# Patient Record
Sex: Female | Born: 1935
Health system: Southern US, Community
[De-identification: ages and names within clinical notes are randomized; demographics above are authoritative.]

## PROBLEM LIST (undated history)

## (undated) DIAGNOSIS — R739 Hyperglycemia, unspecified: Secondary | ICD-10-CM

## (undated) DIAGNOSIS — I10 Essential (primary) hypertension: Secondary | ICD-10-CM

## (undated) DIAGNOSIS — M810 Age-related osteoporosis without current pathological fracture: Secondary | ICD-10-CM

## (undated) HISTORY — DX: Essential (primary) hypertension: I10

## (undated) HISTORY — DX: Age-related osteoporosis without current pathological fracture: M81.0

## (undated) HISTORY — PX: KIDNEY SURGERY: SHX687

## (undated) HISTORY — DX: Hyperglycemia, unspecified: R73.9

## (undated) HISTORY — PX: THYROID SURGERY: SHX805

---

## 2018-04-25 DIAGNOSIS — G40909 Epilepsy, unspecified, not intractable, without status epilepticus: Secondary | ICD-10-CM | POA: Diagnosis not present

## 2018-04-25 DIAGNOSIS — M810 Age-related osteoporosis without current pathological fracture: Secondary | ICD-10-CM | POA: Diagnosis not present

## 2018-04-25 DIAGNOSIS — I10 Essential (primary) hypertension: Secondary | ICD-10-CM | POA: Diagnosis not present

## 2018-04-29 DIAGNOSIS — T781XXA Other adverse food reactions, not elsewhere classified, initial encounter: Secondary | ICD-10-CM | POA: Diagnosis not present

## 2018-06-20 DIAGNOSIS — M810 Age-related osteoporosis without current pathological fracture: Secondary | ICD-10-CM | POA: Diagnosis not present

## 2018-07-01 ENCOUNTER — Ambulatory Visit: Payer: Self-pay | Admitting: Endocrinology

## 2018-07-11 DIAGNOSIS — M810 Age-related osteoporosis without current pathological fracture: Secondary | ICD-10-CM | POA: Diagnosis not present

## 2018-07-21 NOTE — Progress Notes (Signed)
Mardene Celeste called back. There was not any imaging reports included in the records. She provided me with contact information to [facility] Called [phone] and spoke with Sam. She could not confirm or deny any info over the phone, but asked I send request on our letter head for MRI/CT to f#[phone]. Faxed request.  Mardene Celeste also gave me a # to a Dr. Alvina Filbert [phone]

## 2018-07-21 NOTE — Progress Notes (Signed)
Called Dr. Sherryll Burger office, spoke with Mardene Celeste in medical records to determine if they had a copy of MRI or CT reports from [place]. Mardene Celeste stated they have records scanned in from [place]. She is printing the 30 pages and will call me when completed, I will pick them up.

## 2018-07-23 ENCOUNTER — Telehealth (INDEPENDENT_AMBULATORY_CARE_PROVIDER_SITE_OTHER): Payer: Medicare HMO | Admitting: Neurology

## 2018-07-23 ENCOUNTER — Telehealth: Payer: Self-pay

## 2018-07-23 ENCOUNTER — Encounter: Payer: Self-pay | Admitting: Neurology

## 2018-07-23 ENCOUNTER — Other Ambulatory Visit: Payer: Self-pay

## 2018-07-23 VITALS — BP 127/75 | HR 97 | Temp 97.5°F | Ht 60.0 in | Wt 130.0 lb

## 2018-07-23 DIAGNOSIS — D320 Benign neoplasm of cerebral meninges: Secondary | ICD-10-CM

## 2018-07-23 DIAGNOSIS — Z87898 Personal history of other specified conditions: Secondary | ICD-10-CM

## 2018-07-23 NOTE — Telephone Encounter (Signed)
[place], [phone] spoke with Nam in Med rec. She asked I fax request on our letterhead to f# [phone] attn med rec. I advised Pt has appt today and need ASAP.  We rcvd fax from  [place]. It indicated there was not an MRI or CT performed there.

## 2018-07-23 NOTE — Progress Notes (Signed)
Virtual Visit via Video Note The purpose of this virtual visit is to provide medical care while limiting exposure to the novel coronavirus.    Consent was obtained for video visit:  Yes Answered questions that patient had about telehealth interaction:  Yes I discussed the limitations, risks, security and privacy concerns of performing an evaluation and management service by telemedicine. I also discussed with the patient that there may be a patient responsible charge related to this service. The patient expressed understanding and agreed to proceed.  Pt location: Home Physician Location: Home Name of referring provider:  Lajean Manes, MD I connected with Karie Mainland at patients initiation/request on 07/23/2018 at 11:10 AM EDT by video enabled telemedicine application and verified that I am speaking with the correct person using two identifiers. Pt MRN:  [id number] Pt DOB:  [birth year] Video Participants:  Karie Mainland;  Her husband   History of Present Illness:  Gina Pham is an 85 year old woman who presents to establish care regarding meningioma and isolated seizure in setting of meningioma.  In February 2015, she was treated for an infection with a sulfa antibiotic and had a severe anaphylactic reaction.  After discharge from the hospital, she had an episode of incoherence.  She followed up with her PCP who noted that she was acting confused.  She was sent to the ED at the [facility], where CT of head showed a meningioma in the right temporal lobe.  She had an EEG which was reportedly abnormal and diagnosed with probable seizure.  She was started on Keppra 750mg  daily and has not had a recurrent spell.  Once in a while she may see something that is not there, like an animal in the shadows of the foliage, but it is brief and infrequent.  No episodes of phantosmia, abnormal jerking, loss of awareness or subsequent episode of confusion.  Every couple of years, she would have repeat  imaging to monitor the meningioma.  Last MRI of brain with and without contrast from 01/28/17 showed 3.2 x 1.9 x 2.4 cm meningioma in the right middle cranial fossa (miimally increased in size from prior MRI on 04/13/15 which was 3.2 x 1.7 x 2.4 cm) with no significant change in mild regional mass effect and without adjacent parenchymal signal abnormality.  About 4-5 months ago, she and her husband moved from [place] to [place] to live near their children.  They live at [place].  They are wishing to establish care with a new neurologist.  Of note, her mother had a meningioma diagnosed in her 85Y without complication for the rest of her life at age 73.  Past Medical History: hyperlipidemia  Medications: Outpatient Encounter Medications as of 07/23/2018  Medication Sig   cholecalciferol (VITAMIN D3) 25 MCG (1000 UT) tablet Take 2,000 Units by mouth daily.   co-enzyme Q-10 30 MG capsule Take 30 mg by mouth 3 (three) times daily.   Multiple Vitamin (MULTIVITAMIN) tablet Take 1 tablet by mouth daily.   vitamin C (ASCORBIC ACID) 500 MG tablet Take 500 mg by mouth daily.   levETIRAcetam (KEPPRA) 500 MG tablet Take 750 mg by mouth daily.   simvastatin (ZOCOR) 40 MG tablet Take 20 mg by mouth daily.   No facility-administered encounter medications on file as of 07/23/2018.     Allergies: Sulfa  Family History: No family history on file.  Social History: Social History   Socioeconomic History   Marital status: Married    Spouse name: Not  on file   Number of children: Not on file   Years of education: Not on file   Highest education level: Not on file  Occupational History   Not on file  Social Needs   Financial resource strain: Not on file   Food insecurity:    Worry: Not on file    Inability: Not on file   Transportation needs:    Medical: Not on file    Non-medical: Not on file  Tobacco Use   Smoking status: Not on file  Substance and Sexual Activity    Alcohol use: Not on file   Drug use: Not on file   Sexual activity: Not on file  Lifestyle   Physical activity:    Days per week: Not on file    Minutes per session: Not on file   Stress: Not on file  Relationships   Social connections:    Talks on phone: Not on file    Gets together: Not on file    Attends religious service: Not on file    Active member of club or organization: Not on file    Attends meetings of clubs or organizations: Not on file    Relationship status: Not on file   Intimate partner violence:    Fear of current or ex partner: Not on file    Emotionally abused: Not on file    Physically abused: Not on file    Forced sexual activity: Not on file  Other Topics Concern   Not on file  Social History Narrative   Not on file   Observations/Objective:   Vitals:   07/23/18 1111  BP: 127/75  Pulse: 97  Temp: (!) 97.5 F (36.4 C)  Weight: 130 lb (59 kg)  Height: 5' (1.524 m)  no acute distress.  Alert and oriented.  Speech fluent and not dysarthric.  Language intact.  Face symmetric.  Assessment and Plan:   1.  Cerebral meningioma, no significant changes over the past several years.  I don't think we need to recheck another MRI at this time. 2.  Seizure secondary to meningioma, stable on Keppra.  No recurrence.  1.  Keppra 750mg  daily 2.  Follow up in one year or sooner if needed.  Follow Up Instructions:    -I discussed the assessment and treatment plan with the patient. The patient was provided an opportunity to ask questions and all were answered. The patient agreed with the plan and demonstrated an understanding of the instructions.   The patient was advised to call back or seek an in-person evaluation if the symptoms worsen or if the condition fails to improve as anticipated.   Dudley Major, DO

## 2018-07-24 ENCOUNTER — Telehealth: Payer: Self-pay | Admitting: Endocrinology

## 2018-07-24 NOTE — Telephone Encounter (Signed)
LM to call back to reschedule appt for Dr Dwyane Dee - No Show/cancellation list.

## 2018-08-05 DIAGNOSIS — Z79899 Other long term (current) drug therapy: Secondary | ICD-10-CM | POA: Diagnosis not present

## 2018-08-05 DIAGNOSIS — N183 Chronic kidney disease, stage 3 (moderate): Secondary | ICD-10-CM | POA: Diagnosis not present

## 2018-08-05 DIAGNOSIS — G40909 Epilepsy, unspecified, not intractable, without status epilepticus: Secondary | ICD-10-CM | POA: Diagnosis not present

## 2018-08-05 DIAGNOSIS — E7800 Pure hypercholesterolemia, unspecified: Secondary | ICD-10-CM | POA: Diagnosis not present

## 2018-08-05 DIAGNOSIS — I129 Hypertensive chronic kidney disease with stage 1 through stage 4 chronic kidney disease, or unspecified chronic kidney disease: Secondary | ICD-10-CM | POA: Diagnosis not present

## 2018-08-05 DIAGNOSIS — R441 Visual hallucinations: Secondary | ICD-10-CM | POA: Diagnosis not present

## 2018-08-05 DIAGNOSIS — D329 Benign neoplasm of meninges, unspecified: Secondary | ICD-10-CM | POA: Diagnosis not present

## 2018-08-06 DIAGNOSIS — I129 Hypertensive chronic kidney disease with stage 1 through stage 4 chronic kidney disease, or unspecified chronic kidney disease: Secondary | ICD-10-CM | POA: Diagnosis not present

## 2018-08-06 DIAGNOSIS — E612 Magnesium deficiency: Secondary | ICD-10-CM | POA: Diagnosis not present

## 2018-08-06 DIAGNOSIS — Z79899 Other long term (current) drug therapy: Secondary | ICD-10-CM | POA: Diagnosis not present

## 2018-08-06 DIAGNOSIS — G40909 Epilepsy, unspecified, not intractable, without status epilepticus: Secondary | ICD-10-CM | POA: Diagnosis not present

## 2018-08-06 DIAGNOSIS — E7800 Pure hypercholesterolemia, unspecified: Secondary | ICD-10-CM | POA: Diagnosis not present

## 2018-08-08 DIAGNOSIS — H53461 Homonymous bilateral field defects, right side: Secondary | ICD-10-CM | POA: Diagnosis not present

## 2018-08-08 DIAGNOSIS — H53462 Homonymous bilateral field defects, left side: Secondary | ICD-10-CM | POA: Diagnosis not present

## 2018-09-11 DIAGNOSIS — Z7189 Other specified counseling: Secondary | ICD-10-CM | POA: Diagnosis not present

## 2018-09-11 DIAGNOSIS — N183 Chronic kidney disease, stage 3 (moderate): Secondary | ICD-10-CM | POA: Diagnosis not present

## 2018-09-11 DIAGNOSIS — R441 Visual hallucinations: Secondary | ICD-10-CM | POA: Diagnosis not present

## 2018-09-11 DIAGNOSIS — I129 Hypertensive chronic kidney disease with stage 1 through stage 4 chronic kidney disease, or unspecified chronic kidney disease: Secondary | ICD-10-CM | POA: Diagnosis not present

## 2018-09-11 DIAGNOSIS — G40909 Epilepsy, unspecified, not intractable, without status epilepticus: Secondary | ICD-10-CM | POA: Diagnosis not present

## 2018-11-14 DIAGNOSIS — D1801 Hemangioma of skin and subcutaneous tissue: Secondary | ICD-10-CM | POA: Diagnosis not present

## 2018-11-14 DIAGNOSIS — L821 Other seborrheic keratosis: Secondary | ICD-10-CM | POA: Diagnosis not present

## 2018-11-14 DIAGNOSIS — L570 Actinic keratosis: Secondary | ICD-10-CM | POA: Diagnosis not present

## 2018-11-26 DIAGNOSIS — Z119 Encounter for screening for infectious and parasitic diseases, unspecified: Secondary | ICD-10-CM | POA: Diagnosis not present

## 2018-12-04 DIAGNOSIS — Z23 Encounter for immunization: Secondary | ICD-10-CM | POA: Diagnosis not present

## 2018-12-11 DIAGNOSIS — R69 Illness, unspecified: Secondary | ICD-10-CM | POA: Diagnosis not present

## 2019-01-16 DIAGNOSIS — R7303 Prediabetes: Secondary | ICD-10-CM | POA: Diagnosis not present

## 2019-01-16 DIAGNOSIS — R7309 Other abnormal glucose: Secondary | ICD-10-CM | POA: Diagnosis not present

## 2019-01-16 DIAGNOSIS — Z23 Encounter for immunization: Secondary | ICD-10-CM | POA: Diagnosis not present

## 2019-01-16 DIAGNOSIS — G40909 Epilepsy, unspecified, not intractable, without status epilepticus: Secondary | ICD-10-CM | POA: Diagnosis not present

## 2019-01-16 DIAGNOSIS — M810 Age-related osteoporosis without current pathological fracture: Secondary | ICD-10-CM | POA: Diagnosis not present

## 2019-01-16 DIAGNOSIS — N1831 Chronic kidney disease, stage 3a: Secondary | ICD-10-CM | POA: Diagnosis not present

## 2019-01-16 DIAGNOSIS — D329 Benign neoplasm of meninges, unspecified: Secondary | ICD-10-CM | POA: Diagnosis not present

## 2019-01-16 DIAGNOSIS — E7800 Pure hypercholesterolemia, unspecified: Secondary | ICD-10-CM | POA: Diagnosis not present

## 2019-01-16 DIAGNOSIS — I129 Hypertensive chronic kidney disease with stage 1 through stage 4 chronic kidney disease, or unspecified chronic kidney disease: Secondary | ICD-10-CM | POA: Diagnosis not present

## 2019-01-16 DIAGNOSIS — Z0000 Encounter for general adult medical examination without abnormal findings: Secondary | ICD-10-CM | POA: Diagnosis not present

## 2019-01-20 DIAGNOSIS — H2513 Age-related nuclear cataract, bilateral: Secondary | ICD-10-CM | POA: Diagnosis not present

## 2019-01-20 DIAGNOSIS — H25013 Cortical age-related cataract, bilateral: Secondary | ICD-10-CM | POA: Diagnosis not present

## 2019-01-20 DIAGNOSIS — H5340 Unspecified visual field defects: Secondary | ICD-10-CM | POA: Diagnosis not present

## 2019-01-20 DIAGNOSIS — H43813 Vitreous degeneration, bilateral: Secondary | ICD-10-CM | POA: Diagnosis not present

## 2019-01-22 DIAGNOSIS — M810 Age-related osteoporosis without current pathological fracture: Secondary | ICD-10-CM | POA: Diagnosis not present

## 2019-02-03 ENCOUNTER — Telehealth: Payer: Self-pay

## 2019-02-03 DIAGNOSIS — H53461 Homonymous bilateral field defects, right side: Secondary | ICD-10-CM

## 2019-02-03 NOTE — Telephone Encounter (Signed)
Called patient no answer left message to call office back to discuss provider response below

## 2019-02-03 NOTE — Telephone Encounter (Signed)
Called patient no answer left message to call office back to discuss provider response

## 2019-02-03 NOTE — Telephone Encounter (Signed)
-----   Message from Pieter Partridge, DO sent at 02/03/2019  7:04 AM EST ----- Received note from patient's ophthalmologist, Dr. Satira Sark at [facility].  She was evaluated on 01/20/2019.  Findings indicated right inferior visual field defects in both eyes.  This would not be explained by her meningioma, which involves her right cerebral hemisphere.  Therefore, I would like to order MRI of brain with and without contrast for evaluation of right homonymous hemianopsia.  Further recommendations pending results.

## 2019-02-04 NOTE — Telephone Encounter (Signed)
Attempted to call patient again to answer left message to call office back.  Order for MRI place Waiting to discuss with patient.

## 2019-03-05 DIAGNOSIS — Z0000 Encounter for general adult medical examination without abnormal findings: Secondary | ICD-10-CM | POA: Diagnosis not present

## 2019-05-13 DIAGNOSIS — H5340 Unspecified visual field defects: Secondary | ICD-10-CM | POA: Diagnosis not present

## 2019-05-15 DIAGNOSIS — H5340 Unspecified visual field defects: Secondary | ICD-10-CM | POA: Diagnosis not present

## 2019-05-18 DIAGNOSIS — R7303 Prediabetes: Secondary | ICD-10-CM | POA: Diagnosis not present

## 2019-05-18 DIAGNOSIS — H539 Unspecified visual disturbance: Secondary | ICD-10-CM | POA: Diagnosis not present

## 2019-05-18 DIAGNOSIS — Z79899 Other long term (current) drug therapy: Secondary | ICD-10-CM | POA: Diagnosis not present

## 2019-05-18 DIAGNOSIS — I129 Hypertensive chronic kidney disease with stage 1 through stage 4 chronic kidney disease, or unspecified chronic kidney disease: Secondary | ICD-10-CM | POA: Diagnosis not present

## 2019-05-18 DIAGNOSIS — R413 Other amnesia: Secondary | ICD-10-CM | POA: Diagnosis not present

## 2019-05-18 DIAGNOSIS — G40909 Epilepsy, unspecified, not intractable, without status epilepticus: Secondary | ICD-10-CM | POA: Diagnosis not present

## 2019-05-18 DIAGNOSIS — N1831 Chronic kidney disease, stage 3a: Secondary | ICD-10-CM | POA: Diagnosis not present

## 2019-07-21 DIAGNOSIS — R69 Illness, unspecified: Secondary | ICD-10-CM | POA: Diagnosis not present

## 2019-07-23 NOTE — Progress Notes (Signed)
NEUROLOGY FOLLOW UP OFFICE NOTE  Gina Pham [id number]  HISTORY OF PRESENT ILLNESS: Gina Pham is an 86 year old right-handed woman who follows up for meningioma and isolated seizure in setting of meningioma.  She is accompanied by her husband who provides collateral history.  Recent history also supplemented by PCP notes.  UPDATE: Current medications:  Keppra 750mg  daily  She has had baseline mild memory deficits and visual hallucinations for some time, but they have both gotten worse over the past year.  She has increased difficulty remembering appointment times, dates, and word-finding difficulty.  She has not had difficulty recognizing close family and friends.  Visual hallucinations are still periodic but definitely more frequent.  She sees animals as well as people in the yard who are not there.  One time, she saw people across the street with a sign on a door that said "do not enter".  She has not been agitated or confused.  Mood and behavior have been okay.  She overall remains independent in regards to activities of daily living.  Sometimes she has trouble reading clocks or difficulty reaching for objects.  No headaches or clinical seizures.  B12 and thyroid function have been checked by her PCP, and reportedly unremarkable.  She had eye exam back in November which revealed right inferior quandranopia.  HISTORY: In February 2015, she was treated for an infection with a sulfa antibiotic and had a severe anaphylactic reaction.  After discharge from the hospital, she had an episode of incoherence.  She followed up with her PCP who noted that she was acting confused.  She was sent to the ED at the [facility], where CT of head showed a meningioma in the right temporal lobe.  She had an EEG which was reportedly abnormal and diagnosed with probable seizure.  She was started on Keppra 750mg  daily and has not had a recurrent spell.  Once in a while she may see something that is not  there, like an animal in the shadows of the foliage, but it is brief and infrequent.  No episodes of phantosmia, abnormal jerking, loss of awareness or subsequent episode of confusion.  Every couple of years, she would have repeat imaging to monitor the meningioma.  MRI of brain with and without contrast from 01/28/17 showed 3.2 x 1.9 x 2.4 cm meningioma in the right middle cranial fossa (miimally increased in size from prior MRI on 04/13/15 which was 3.2 x 1.7 x 2.4 cm) with no significant change in mild regional mass effect and without adjacent parenchymal signal abnormality.  In early 2020, she and her husband moved from [place] to [place] to live near their children.  They live at [place].  Mother had meninigoma no depression 100.  Father MI in 81s.   PAST MEDICAL HISTORY: No past medical history on file.  MEDICATIONS: Current Outpatient Medications on File Prior to Visit  Medication Sig Dispense Refill  . cholecalciferol (VITAMIN D3) 25 MCG (1000 UT) tablet Take 2,000 Units by mouth daily.    Marland Kitchen co-enzyme Q-10 30 MG capsule Take 30 mg by mouth 3 (three) times daily.    Marland Kitchen levETIRAcetam (KEPPRA) 500 MG tablet Take 750 mg by mouth daily.    . Multiple Vitamin (MULTIVITAMIN) tablet Take 1 tablet by mouth daily.    . simvastatin (ZOCOR) 40 MG tablet Take 20 mg by mouth daily.    . vitamin C (ASCORBIC ACID) 500 MG tablet Take 500 mg by mouth daily.  No current facility-administered medications on file prior to visit.    ALLERGIES: Allergies  Allergen Reactions  . Sulfa Antibiotics     FAMILY HISTORY: No family history on file.  SOCIAL HISTORY: Social History   Socioeconomic History  . Marital status: Married    Spouse name: Talbert Nan  . Number of children: 2  . Years of education: Not on file  . Highest education level: Not on file  Occupational History  . Occupation: retired  Tobacco Use  . Smoking status: Former Smoker    Types: Cigarettes  . Smokeless tobacco:  Never Used  Substance and Sexual Activity  . Alcohol use: Yes    Comment: wine occasionally  . Drug use: Not on file  . Sexual activity: Not on file  Other Topics Concern  . Not on file  Social History Narrative   Patient is right-handed. She and her husband moved to the area to live with their son.   Social Determinants of Health   Financial Resource Strain:   . Difficulty of Paying Living Expenses:   Food Insecurity:   . Worried About Charity fundraiser in the Last Year:   . Arboriculturist in the Last Year:   Transportation Needs:   . Film/video editor (Medical):   Marland Kitchen Lack of Transportation (Non-Medical):   Physical Activity:   . Days of Exercise per Week:   . Minutes of Exercise per Session:   Stress:   . Feeling of Stress :   Social Connections:   . Frequency of Communication with Friends and Family:   . Frequency of Social Gatherings with Friends and Family:   . Attends Religious Services:   . Active Member of Clubs or Organizations:   . Attends Archivist Meetings:   Marland Kitchen Marital Status:   Intimate Partner Violence:   . Fear of Current or Ex-Partner:   . Emotionally Abused:   Marland Kitchen Physically Abused:   . Sexually Abused:     PHYSICAL EXAM: Blood pressure 134/85, pulse 77, height 5\' 1"  (1.549 m), weight 141 lb 6.4 oz (64.1 kg), SpO2 98 %. General: No acute distress.  Patient appears well-groomed.   Head:  Normocephalic/atraumatic Eyes:  Fundi examined but not visualized Neck: supple, no paraspinal tenderness, full range of motion Heart:  Regular rate and rhythm Lungs:  Clear to auscultation bilaterally Back: No paraspinal tenderness Neurological Exam: alert and oriented to person, place, and time. Attention span and concentration intact, recent and remote memory intact, fund of knowledge intact.  Speech fluent and not dysarthric, language intact.   [place] Mental Exam 07/24/2019  Weekday Correct 1  Current year 0  What state are we in? 1    Amount spent 1  Amount left 0  # of Animals 0  5 objects recall 0  Number series 2  Hour markers 0  Time correct 0  Placed X in triangle correctly 1  Largest Figure 1  Name of female 0  Date back to work 0  Type of work 0  State she lived in 0  Total score 7   Inconsistent visual field testing (possibly defect in left temporal lower quadrant in left eye and temporal lower quadrant in right eye.  Otherwise, CN II-XII intact. Bulk and tone normal, muscle strength 5/5 throughout.  Sensation to light touch, temperature and vibration intact.  Deep tendon reflexes 2+ throughout, toes downgoing.  Finger to nose and heel to shin testing intact.  Gait normal, Romberg  negative.  IMPRESSION: 1.  Right temporal lobe meningioma 2.  Visual hallucinations, may be manifestation of temporal lobe seizures. 3.  Worsening memory, may be underlying early dementia/mild neurocognitive disorder vs other secondary intracranial etiology 4.  Right inferior quadranopia.  Not appreciated on my exam but clearly noted on ophthalmologic exam.  Does not correlate with right temporal lobe meningioma.  Question if she may have had a subclinical left hemispheric infarct.   PLAN: 1.  MRI of brain with and without contrast 2.  Increase Keppra to 500mg  twice daily 3.  Follow up after MRI  Metta Clines, DO  CC: Lajean Manes, MD

## 2019-07-24 ENCOUNTER — Ambulatory Visit: Payer: Medicare HMO | Admitting: Neurology

## 2019-07-24 ENCOUNTER — Encounter: Payer: Self-pay | Admitting: Neurology

## 2019-07-24 ENCOUNTER — Other Ambulatory Visit: Payer: Self-pay

## 2019-07-24 VITALS — BP 134/85 | HR 77 | Ht 61.0 in | Wt 141.4 lb

## 2019-07-24 DIAGNOSIS — G40909 Epilepsy, unspecified, not intractable, without status epilepticus: Secondary | ICD-10-CM | POA: Diagnosis not present

## 2019-07-24 DIAGNOSIS — G3184 Mild cognitive impairment, so stated: Secondary | ICD-10-CM

## 2019-07-24 DIAGNOSIS — M810 Age-related osteoporosis without current pathological fracture: Secondary | ICD-10-CM | POA: Diagnosis not present

## 2019-07-24 DIAGNOSIS — D320 Benign neoplasm of cerebral meninges: Secondary | ICD-10-CM

## 2019-07-24 DIAGNOSIS — D329 Benign neoplasm of meninges, unspecified: Secondary | ICD-10-CM | POA: Diagnosis not present

## 2019-07-24 DIAGNOSIS — R569 Unspecified convulsions: Secondary | ICD-10-CM | POA: Diagnosis not present

## 2019-07-24 DIAGNOSIS — R413 Other amnesia: Secondary | ICD-10-CM | POA: Diagnosis not present

## 2019-07-24 DIAGNOSIS — R441 Visual hallucinations: Secondary | ICD-10-CM | POA: Diagnosis not present

## 2019-07-24 DIAGNOSIS — R7303 Prediabetes: Secondary | ICD-10-CM | POA: Diagnosis not present

## 2019-07-24 MED ORDER — LEVETIRACETAM 500 MG PO TABS: 500.0000 mg | ORAL_TABLET | Freq: Two times a day (BID) | ORAL | 5 refills | Status: DC

## 2019-07-24 NOTE — Patient Instructions (Addendum)
1.  Hallucinations and visual field defect likely related to the meningioma 2.  The memory deficits are likely separate, mild cognitive impairment  To try and reduce hallucinations, I will increase levetiracetam to 500mg  twice daily We will also order MRI of brain with and without contrast to evaluate the meningioma Follow up for videovisit after MRI to discuss  We have sent a referral to [facility] for your MRI and they will call you directly to schedule your appointment. They are located at [place]. If you need to contact them directly please call [phone].

## 2019-08-04 ENCOUNTER — Telehealth: Payer: Self-pay | Admitting: Neurology

## 2019-08-04 NOTE — Telephone Encounter (Signed)
That is okay.

## 2019-08-04 NOTE — Telephone Encounter (Signed)
Patient's spouse called and left a message stating, "I'd like a call back from a nurse or a medical assistant regarding an issue my wife is having."  I returned the call to get more information. He said the patient's "hallucianations and cognitive issues seem to have increased since increasing the patient's Keppra from 750 mg to 1000 mg." He'd like to know if it's is okay to decrease the Keppra back to 750 MG until after the MRI and next visit with Dr. Tomi Likens.  Kristopher Oppenheim on [address]

## 2019-08-04 NOTE — Telephone Encounter (Signed)
Advised pt Husband that will be okay to decrease the Keppra to 750mg  for right now until her appt. Please let us know if there are any other changes before then.

## 2019-08-11 DIAGNOSIS — R69 Illness, unspecified: Secondary | ICD-10-CM | POA: Diagnosis not present

## 2019-08-25 ENCOUNTER — Other Ambulatory Visit: Payer: Self-pay

## 2019-08-25 ENCOUNTER — Ambulatory Visit
Admission: RE | Admit: 2019-08-25 | Discharge: 2019-08-25 | Disposition: A | Discharge status: Home / Self Care | Payer: Medicare HMO | Source: Ambulatory Visit | Attending: Neurology | Admitting: Neurology

## 2019-08-25 DIAGNOSIS — G3184 Mild cognitive impairment, so stated: Secondary | ICD-10-CM

## 2019-08-25 DIAGNOSIS — D320 Benign neoplasm of cerebral meninges: Secondary | ICD-10-CM

## 2019-08-25 DIAGNOSIS — R441 Visual hallucinations: Secondary | ICD-10-CM

## 2019-08-25 DIAGNOSIS — R569 Unspecified convulsions: Secondary | ICD-10-CM

## 2019-08-25 DIAGNOSIS — D329 Benign neoplasm of meninges, unspecified: Secondary | ICD-10-CM | POA: Diagnosis not present

## 2019-08-25 MED ORDER — GADOBENATE DIMEGLUMINE 529 MG/ML IV SOLN
13.0000 mL | Freq: Once | INTRAVENOUS | Status: AC | PRN
  Administered 2019-08-25: 13 mL via INTRAVENOUS

## 2019-10-05 NOTE — Progress Notes (Signed)
NEUROLOGY FOLLOW UP OFFICE NOTE  Gina Pham [id number]  HISTORY OF PRESENT ILLNESS: Gina Pham is an 86 year old right-handed woman who follows up for seizure and meningioma.  She is accompanied by her husband and son who provide collateral history.    UPDATE: We increased Keppra to 500mg  twice daily but reduced dose back to 750mg  daily due to increased visual hallucinations.  However, the visual hallucination have not improved and are frequent.  When she looks outside the window, she has seen people and dogs in the back yard not really there.  She becomes agitated at times and wants to talk to management at [facility] about this matter.  She is having difficulty tracking time.  If she has an appointment, she will start getting ready at 3 AM so not to be late.  She continues to have short-term memory problems but also has long-term memory difficulty such as her wedding date.  She has visuospatial deficits, such as difficulty finding her fork on the table in front of her.  She has bilateral cataract.  MRI of brain with and without contrast on 08/25/2019 personally reviewed and demonstrated stable 1.9 x 2.5 x 2.7 cm meningioma along the right greater wing and frontotemporal convexity with mild mass effect and no underlying parenchymal edema as well as possible right-sided pituitary microadenoma but no acute infarct or other acute intracranial abnormality.  HISTORY: In February 2015, she was treated for an infection with a sulfa antibiotic and had a severe anaphylactic reaction. After discharge from the hospital, she had an episode of incoherence. She followed up with her PCP who noted that she was acting confused. She was sent to the ED at the [facility], where CT of head showed a meningioma in the right temporal lobe. She had an EEG which was reportedly abnormal and diagnosed with probable seizure. She was started on Keppra 750mg  daily and has not had a recurrent spell. Once in  a while she may see something that is not there, like an animal in the shadows of the foliage, but it is brief and infrequent. No episodes of phantosmia, abnormal jerking, loss of awareness or subsequent episode of confusion. Every couple of years, she would have repeat imaging to monitor the meningioma. MRI of brain with and without contrast from 01/28/17 showed 3.2 x 1.9 x 2.4 cm meningioma in the right middle cranial fossa (miimally increased in size from prior MRI on 04/13/15 which was 3.2 x 1.7 x 2.4 cm) with no significant change in mild regional mass effect and without adjacent parenchymal signal abnormality.  In early 2020, she and her husband moved from [place] to [place] to live near their children. They live at [place]. She has had baseline mild memory deficits and visual hallucinations for some time, but they have both gotten worse over the past year.  She has increased difficulty remembering appointment times, dates, and word-finding difficulty.  She has not had difficulty recognizing close family and friends.  Visual hallucinations are still periodic but definitely more frequent.  She sees animals as well as people in the yard who are not there.  One time, she saw people across the street with a sign on a door that said "do not enter".  She has not been agitated or confused.  Mood and behavior have been okay.  She overall remains independent in regards to activities of daily living.  Sometimes she has trouble reading clocks or difficulty reaching for objects.  No headaches or  clinical seizures.  B12 and thyroid function have been checked by her PCP, and reportedly unremarkable.  She had eye exam back in November which revealed right inferior quandranopia.  Mother had meninigoma no depression 100.  Father MI in 30s.  PAST MEDICAL HISTORY: No past medical history on file.  MEDICATIONS: Current Outpatient Medications on File Prior to Visit  Medication Sig Dispense Refill   calcium  carbonate (TUMS EX) 750 MG chewable tablet Chew 1 tablet by mouth daily.     cholecalciferol (VITAMIN D3) 25 MCG (1000 UT) tablet Take 2,000 Units by mouth daily.     co-enzyme Q-10 30 MG capsule Take 30 mg by mouth 3 (three) times daily.     levETIRAcetam (KEPPRA) 500 MG tablet Take 1 tablet (500 mg total) by mouth 2 (two) times daily. 60 tablet 5   magnesium 30 MG tablet Take 250 mg by mouth daily.     Multiple Vitamin (MULTIVITAMIN) tablet Take 1 tablet by mouth daily.     simvastatin (ZOCOR) 40 MG tablet Take 20 mg by mouth daily.     vitamin C (ASCORBIC ACID) 500 MG tablet Take 500 mg by mouth daily.     No current facility-administered medications on file prior to visit.    ALLERGIES: Allergies  Allergen Reactions   Sulfa Antibiotics     FAMILY HISTORY: Mother:  Cerebral meningioma  SOCIAL HISTORY: Social History   Socioeconomic History   Marital status: Married    Spouse name: Talbert Nan   Number of children: 2   Years of education: Not on file   Highest education level: Not on file  Occupational History   Occupation: retired  Tobacco Use   Smoking status: Former Smoker    Types: Cigarettes   Smokeless tobacco: Never Used  Substance and Sexual Activity   Alcohol use: Yes    Comment: wine occasionally   Drug use: Not on file   Sexual activity: Not on file  Other Topics Concern   Not on file  Social History Narrative   Patient is right-handed. She and her husband moved to the area to live with their son.   Social Determinants of Health   Financial Resource Strain:    Difficulty of Paying Living Expenses:   Food Insecurity:    Worried About Charity fundraiser in the Last Year:    Arboriculturist in the Last Year:   Transportation Needs:    Film/video editor (Medical):    Lack of Transportation (Non-Medical):   Physical Activity:    Days of Exercise per Week:    Minutes of Exercise per Session:   Stress:    Feeling of Stress :    Social Connections:    Frequency of Communication with Friends and Family:    Frequency of Social Gatherings with Friends and Family:    Attends Religious Services:    Active Member of Clubs or Organizations:    Attends Archivist Meetings:    Marital Status:   Intimate Partner Violence:    Fear of Current or Ex-Partner:    Emotionally Abused:    Physically Abused:    Sexually Abused:     PHYSICAL EXAM: Blood pressure 129/82, pulse 80, height 5' (1.524 m), weight 142 lb (64.4 kg), SpO2 97 %. General: No acute distress.  Patient appears well-groomed.   Head:  Normocephalic/atraumatic   IMPRESSION: 1.  Cerebral meningioma.  When compared to prior imaging from [place], appears stablel 2.  Visual hallucinations  likely secondary to underlying dementia.  As they are not causing any significant distress, would not treat as side effects of antipsychotic medication (such as prolonged QT interval) would outweigh benefits.   3.  Right-sided homonymous hemianopsia.  No stroke on MRI to explain symptoms.  May be related to pituitary microadenoma.  After discussion, we decided not to pursue dedicated MRI of pituitary as will likely not change management as she would not undergo any potential surgery.  PLAN: 1.  Continue Keppra 750mg  daily 2.  No driving 3.  Follow up with Dr. Katy Fitch 4.  Follow up in  6 months.  Total time spent with patient, her family and reviewing chart:  37 minutes  Metta Clines, DO  CC: Lajean Manes, MD

## 2019-10-06 ENCOUNTER — Telehealth: Payer: Self-pay

## 2019-10-06 ENCOUNTER — Ambulatory Visit: Payer: Medicare HMO | Admitting: Neurology

## 2019-10-06 ENCOUNTER — Other Ambulatory Visit: Payer: Self-pay

## 2019-10-06 ENCOUNTER — Encounter: Payer: Self-pay | Admitting: Neurology

## 2019-10-06 VITALS — BP 129/82 | HR 80 | Ht 60.0 in | Wt 142.0 lb

## 2019-10-06 DIAGNOSIS — F0150 Vascular dementia without behavioral disturbance: Secondary | ICD-10-CM

## 2019-10-06 DIAGNOSIS — D320 Benign neoplasm of cerebral meninges: Secondary | ICD-10-CM | POA: Diagnosis not present

## 2019-10-06 DIAGNOSIS — H53461 Homonymous bilateral field defects, right side: Secondary | ICD-10-CM

## 2019-10-06 DIAGNOSIS — R69 Illness, unspecified: Secondary | ICD-10-CM | POA: Diagnosis not present

## 2019-10-06 DIAGNOSIS — F0390 Unspecified dementia without behavioral disturbance: Secondary | ICD-10-CM

## 2019-10-06 NOTE — Patient Instructions (Signed)
1.  Continue levetiracetam 750mg  daily 2.  Follow up with Dr. Katy Fitch 3.  Follow up with me in 6 months

## 2019-10-27 DIAGNOSIS — L820 Inflamed seborrheic keratosis: Secondary | ICD-10-CM | POA: Diagnosis not present

## 2019-10-27 DIAGNOSIS — L814 Other melanin hyperpigmentation: Secondary | ICD-10-CM | POA: Diagnosis not present

## 2019-10-27 DIAGNOSIS — L821 Other seborrheic keratosis: Secondary | ICD-10-CM | POA: Diagnosis not present

## 2019-10-27 DIAGNOSIS — L570 Actinic keratosis: Secondary | ICD-10-CM | POA: Diagnosis not present

## 2019-10-27 DIAGNOSIS — D1801 Hemangioma of skin and subcutaneous tissue: Secondary | ICD-10-CM | POA: Diagnosis not present

## 2019-11-03 ENCOUNTER — Ambulatory Visit: Payer: Medicare HMO | Admitting: Neurology

## 2019-12-02 DIAGNOSIS — Z23 Encounter for immunization: Secondary | ICD-10-CM | POA: Diagnosis not present

## 2019-12-14 DIAGNOSIS — H2513 Age-related nuclear cataract, bilateral: Secondary | ICD-10-CM | POA: Diagnosis not present

## 2019-12-14 DIAGNOSIS — H53453 Other localized visual field defect, bilateral: Secondary | ICD-10-CM | POA: Diagnosis not present

## 2020-02-04 DIAGNOSIS — R69 Illness, unspecified: Secondary | ICD-10-CM | POA: Diagnosis not present

## 2020-02-12 DIAGNOSIS — E7800 Pure hypercholesterolemia, unspecified: Secondary | ICD-10-CM | POA: Diagnosis not present

## 2020-02-12 DIAGNOSIS — I129 Hypertensive chronic kidney disease with stage 1 through stage 4 chronic kidney disease, or unspecified chronic kidney disease: Secondary | ICD-10-CM | POA: Diagnosis not present

## 2020-02-12 DIAGNOSIS — R69 Illness, unspecified: Secondary | ICD-10-CM | POA: Diagnosis not present

## 2020-02-12 DIAGNOSIS — R7303 Prediabetes: Secondary | ICD-10-CM | POA: Diagnosis not present

## 2020-02-12 DIAGNOSIS — Z23 Encounter for immunization: Secondary | ICD-10-CM | POA: Diagnosis not present

## 2020-02-12 DIAGNOSIS — Z79899 Other long term (current) drug therapy: Secondary | ICD-10-CM | POA: Diagnosis not present

## 2020-02-12 DIAGNOSIS — Z0000 Encounter for general adult medical examination without abnormal findings: Secondary | ICD-10-CM | POA: Diagnosis not present

## 2020-02-12 DIAGNOSIS — G40909 Epilepsy, unspecified, not intractable, without status epilepticus: Secondary | ICD-10-CM | POA: Diagnosis not present

## 2020-02-12 DIAGNOSIS — N1831 Chronic kidney disease, stage 3a: Secondary | ICD-10-CM | POA: Diagnosis not present

## 2020-02-12 DIAGNOSIS — Z1389 Encounter for screening for other disorder: Secondary | ICD-10-CM | POA: Diagnosis not present

## 2020-02-15 ENCOUNTER — Other Ambulatory Visit: Payer: Self-pay | Admitting: Geriatric Medicine

## 2020-02-15 DIAGNOSIS — Z1231 Encounter for screening mammogram for malignant neoplasm of breast: Secondary | ICD-10-CM

## 2020-03-28 DIAGNOSIS — I129 Hypertensive chronic kidney disease with stage 1 through stage 4 chronic kidney disease, or unspecified chronic kidney disease: Secondary | ICD-10-CM | POA: Diagnosis not present

## 2020-03-28 DIAGNOSIS — R69 Illness, unspecified: Secondary | ICD-10-CM | POA: Diagnosis not present

## 2020-03-28 DIAGNOSIS — N1831 Chronic kidney disease, stage 3a: Secondary | ICD-10-CM | POA: Diagnosis not present

## 2020-03-28 DIAGNOSIS — R7303 Prediabetes: Secondary | ICD-10-CM | POA: Diagnosis not present

## 2020-03-28 DIAGNOSIS — G40909 Epilepsy, unspecified, not intractable, without status epilepticus: Secondary | ICD-10-CM | POA: Diagnosis not present

## 2020-03-28 DIAGNOSIS — Z79899 Other long term (current) drug therapy: Secondary | ICD-10-CM | POA: Diagnosis not present

## 2020-04-04 DIAGNOSIS — G40909 Epilepsy, unspecified, not intractable, without status epilepticus: Secondary | ICD-10-CM | POA: Diagnosis not present

## 2020-04-05 NOTE — Progress Notes (Signed)
NEUROLOGY FOLLOW UP OFFICE NOTE  Gina Pham [id number]   Subjective:  Gina Pham is an47year old right-handedwoman who follows up for seizure and cerebral meningioma She is accompanied by her husband and son who provide collateral history.   UPDATE: Current medication:  Keppra 750mg  daily   She had a repeat eye exam but visual field testing was unreliable.  She has continuous visual hallucinations such as seeing people.  They annoy her but they do not frighten her or make her upset.  She will ask her husband if they are real and he will tell her.  She was on Paxil which really helped with the anxiety but stopped a week ago due to diarrhea.  The Paxil was very effective in controlling her anxiety.  However, she continues to have diarrhea, so she may restart it.    HISTORY: In February 2015, she was treated for an infection with a sulfa antibiotic and had a severe anaphylactic reaction. After discharge from the hospital, she had an episode of incoherence. She followed up with her PCP who noted that she was acting confused. She was sent to the ED at the [facility], where CT of head showed a meningioma in the right temporal lobe. She had an EEG which was reportedly abnormal and diagnosed with probable seizure. She was started on Keppra 750mg  daily and has not had a recurrent spell. Once in a while she may see something that is not there, like an animal in the shadows of the foliage, but it is brief and infrequent. No episodes of phantosmia, abnormal jerking, loss of awareness or subsequent episode of confusion. Every couple of years, she would have repeat imaging to monitor the meningioma. MRI of brain with and without contrast from 01/28/17 showed 3.2 x 1.9 x 2.4 cm meningioma in the right middle cranial fossa (miimally increased in size from prior MRI on 04/13/15 which was 3.2 x 1.7 x 2.4 cm) with no significant change in mild regional mass effect and without adjacent  parenchymal signal abnormality.  In early 2020, she and her husband moved from [place] to [place] to live near their children. They live at [place].She has had baseline mild memory deficitsand visual hallucinations for some time, but they have both gotten worse over the past year. She has increased difficulty remembering appointment times, dates, and word-finding difficulty. She has not had difficulty recognizing close family and friends. Visual hallucinations are still periodic but definitely more frequent. She sees animals as well as people in the yard who are not there. One time, she saw people across the street with a sign on a door that said "do not enter". She has not been agitated or confused. Mood and behavior have been okay. She overall remains independent in regards to activities of daily living. Sometimes she has trouble reading clocks or difficulty reaching for objects. No headaches or clinical seizures. B12 and thyroid function have been checked by her PCP, and reportedly unremarkable.She had eye exam back in November 2020 which revealed right inferior quandranopia.  We increased Keppra to 500mg  twice daily but reduced dose back to 750mg  daily due to increased visual hallucinations.  However, the visual hallucination have not improved and are frequent.  When she looks outside the window, she has seen people and dogs in the back yard not really there.  She becomes agitated at times and wants to talk to management at [facility] about this matter.  She is having difficulty tracking time.  If she  has an appointment, she will start getting ready at 3 AM so not to be late.  She continues to have short-term memory problems but also has long-term memory difficulty such as her wedding date.  She has visuospatial deficits, such as difficulty finding her fork on the table in front of her.  She has bilateral cataract.  MRI of brain with and without contrast on 08/25/2019 demonstrated  stable 1.9 x 2.5 x 2.7 cm meningioma along the right greater wing and frontotemporal convexity with mild mass effect and no underlying parenchymal edema as well as possible right-sided pituitary microadenoma but no acute infarct or other acute intracranial abnormality.  Mother had meninigoma no depression 100. Father MI in 56s.  PAST MEDICAL HISTORY: Past Medical History:  Diagnosis Date  . Hyperglycemia   . Hypertension   . Osteoporosis     MEDICATIONS: Current Outpatient Medications on File Prior to Visit  Medication Sig Dispense Refill  . calcium carbonate (TUMS EX) 750 MG chewable tablet Chew 1 tablet by mouth daily.    . cholecalciferol (VITAMIN D3) 25 MCG (1000 UT) tablet Take 2,000 Units by mouth daily.    Marland Kitchen co-enzyme Q-10 30 MG capsule Take 30 mg by mouth 3 (three) times daily.    Marland Kitchen denosumab (PROLIA) 60 MG/ML SOSY injection Inject 60 mg into the skin every 6 (six) months.    . levETIRAcetam (KEPPRA) 500 MG tablet Take 1 tablet (500 mg total) by mouth 2 (two) times daily. 60 tablet 5  . magnesium 30 MG tablet Take 250 mg by mouth daily.    . Multiple Vitamin (MULTIVITAMIN) tablet Take 1 tablet by mouth daily.    . simvastatin (ZOCOR) 40 MG tablet Take 20 mg by mouth daily.    . vitamin C (ASCORBIC ACID) 500 MG tablet Take 500 mg by mouth daily.     No current facility-administered medications on file prior to visit.    ALLERGIES: Allergies  Allergen Reactions  . Sulfa Antibiotics     FAMILY HISTORY: No family history on file.  SOCIAL HISTORY: Social History   Socioeconomic History  . Marital status: Married    Spouse name: Talbert Nan  . Number of children: 2  . Years of education: Not on file  . Highest education level: Not on file  Occupational History  . Occupation: retired  Tobacco Use  . Smoking status: Former Smoker    Types: Cigarettes  . Smokeless tobacco: Never Used  Vaping Use  . Vaping Use: Never used  Substance and Sexual Activity  . Alcohol use:  Yes    Comment: wine occasionally  . Drug use: Never  . Sexual activity: Not Currently  Other Topics Concern  . Not on file  Social History Narrative   Patient is right-handed. She and her husband moved to the area to live with their son.   Social Determinants of Health   Financial Resource Strain: Not on file  Food Insecurity: Not on file  Transportation Needs: Not on file  Physical Activity: Not on file  Stress: Not on file  Social Connections: Not on file  Intimate Partner Violence: Not on file     Objective:  Blood pressure (!) 149/88, pulse 77, height 5' (1.524 m), weight 132 lb (59.9 kg), SpO2 99 %. General: No acute distress.  Patient appears well-groomed.   Head:  Normocephalic/atraumatic Eyes:  Fundi examined but not visualized Neck: supple, no paraspinal tenderness, full range of motion Heart:  Regular rate and rhythm Lungs:  Clear  to auscultation bilaterally Back: No paraspinal tenderness Neurological Exam: alert and oriented to person, place, and time. Speech fluent and not dysarthric, language intact.  Visual field testing grossly intact. CN II-XII intact. Bulk and tone normal, muscle strength 5/5 throughout.  Sensation to light touch intact.  Deep tendon reflexes 1+ throughout.  Finger to nose  testing intact.  Gait steady, Romberg negative.   Assessment/Plan:   1.  Cerebral meningioma 2.  Visual hallucinations likely secondary to underlying dementia (query dementia with Lewy Body disease).  Unclear of extent of visual loss but also consider Sherran Needs syndrome.  I do not think these are seizures.    I think management should be focused on symptomatic treatment.  I wouldn't start a cholinesterase inhibitor as side effects may outweigh benefits.  We discussed starting an antipsychotic medication to treat the hallucinations.  Due to [facility] Box warning and the fact that the hallucinations aren't upsetting to her, I wouldn't start one and family agrees.  I think an  SSRI such as the Paxil is the best option due to side effect profile and efficacy.  It seemed to really control her anxiety in setting of these hallucinations.  If she continues to have diarrhea over the next couple of days, then I would restart the Paxil.  I stressed the importance of hydration and to drink water to avoid dehydration.  Continue Keppra.  Follow up in 6 months.   Metta Clines, DO  CC: Lajean Manes, MD

## 2020-04-07 ENCOUNTER — Encounter: Payer: Self-pay | Admitting: Neurology

## 2020-04-07 ENCOUNTER — Other Ambulatory Visit: Payer: Self-pay

## 2020-04-07 ENCOUNTER — Ambulatory Visit (INDEPENDENT_AMBULATORY_CARE_PROVIDER_SITE_OTHER): Payer: Medicare HMO | Admitting: Neurology

## 2020-04-07 VITALS — BP 149/88 | HR 77 | Ht 60.0 in | Wt 132.0 lb

## 2020-04-07 DIAGNOSIS — R441 Visual hallucinations: Secondary | ICD-10-CM

## 2020-04-07 DIAGNOSIS — D320 Benign neoplasm of cerebral meninges: Secondary | ICD-10-CM | POA: Diagnosis not present

## 2020-04-07 DIAGNOSIS — R69 Illness, unspecified: Secondary | ICD-10-CM | POA: Diagnosis not present

## 2020-04-07 DIAGNOSIS — F0390 Unspecified dementia without behavioral disturbance: Secondary | ICD-10-CM | POA: Diagnosis not present

## 2020-04-07 NOTE — Patient Instructions (Signed)
Hopefully you can restart the Paxil Keep hydrated (water).  This is very important.

## 2020-04-15 DIAGNOSIS — N1831 Chronic kidney disease, stage 3a: Secondary | ICD-10-CM | POA: Diagnosis not present

## 2020-04-15 DIAGNOSIS — R69 Illness, unspecified: Secondary | ICD-10-CM | POA: Diagnosis not present

## 2020-04-15 DIAGNOSIS — M810 Age-related osteoporosis without current pathological fracture: Secondary | ICD-10-CM | POA: Diagnosis not present

## 2020-04-18 DIAGNOSIS — N183 Chronic kidney disease, stage 3 unspecified: Secondary | ICD-10-CM | POA: Diagnosis not present

## 2020-05-04 DIAGNOSIS — K591 Functional diarrhea: Secondary | ICD-10-CM | POA: Diagnosis not present

## 2020-05-04 DIAGNOSIS — N1831 Chronic kidney disease, stage 3a: Secondary | ICD-10-CM | POA: Diagnosis not present

## 2020-05-04 DIAGNOSIS — R69 Illness, unspecified: Secondary | ICD-10-CM | POA: Diagnosis not present

## 2020-05-04 DIAGNOSIS — M545 Low back pain, unspecified: Secondary | ICD-10-CM | POA: Diagnosis not present

## 2020-05-04 DIAGNOSIS — I129 Hypertensive chronic kidney disease with stage 1 through stage 4 chronic kidney disease, or unspecified chronic kidney disease: Secondary | ICD-10-CM | POA: Diagnosis not present

## 2020-05-05 DIAGNOSIS — Z1159 Encounter for screening for other viral diseases: Secondary | ICD-10-CM | POA: Diagnosis not present

## 2020-05-05 DIAGNOSIS — Z20828 Contact with and (suspected) exposure to other viral communicable diseases: Secondary | ICD-10-CM | POA: Diagnosis not present

## 2020-05-12 DIAGNOSIS — Z20828 Contact with and (suspected) exposure to other viral communicable diseases: Secondary | ICD-10-CM | POA: Diagnosis not present

## 2020-05-12 DIAGNOSIS — Z1159 Encounter for screening for other viral diseases: Secondary | ICD-10-CM | POA: Diagnosis not present

## 2020-05-19 DIAGNOSIS — Z20828 Contact with and (suspected) exposure to other viral communicable diseases: Secondary | ICD-10-CM | POA: Diagnosis not present

## 2020-05-19 DIAGNOSIS — Z1159 Encounter for screening for other viral diseases: Secondary | ICD-10-CM | POA: Diagnosis not present

## 2020-05-26 DIAGNOSIS — Z20828 Contact with and (suspected) exposure to other viral communicable diseases: Secondary | ICD-10-CM | POA: Diagnosis not present

## 2020-05-26 DIAGNOSIS — Z1159 Encounter for screening for other viral diseases: Secondary | ICD-10-CM | POA: Diagnosis not present

## 2020-06-02 DIAGNOSIS — R69 Illness, unspecified: Secondary | ICD-10-CM | POA: Diagnosis not present

## 2020-06-02 DIAGNOSIS — J301 Allergic rhinitis due to pollen: Secondary | ICD-10-CM | POA: Diagnosis not present

## 2020-06-02 DIAGNOSIS — Z1159 Encounter for screening for other viral diseases: Secondary | ICD-10-CM | POA: Diagnosis not present

## 2020-06-02 DIAGNOSIS — D329 Benign neoplasm of meninges, unspecified: Secondary | ICD-10-CM | POA: Diagnosis not present

## 2020-06-02 DIAGNOSIS — N1831 Chronic kidney disease, stage 3a: Secondary | ICD-10-CM | POA: Diagnosis not present

## 2020-06-02 DIAGNOSIS — Z20828 Contact with and (suspected) exposure to other viral communicable diseases: Secondary | ICD-10-CM | POA: Diagnosis not present

## 2020-06-02 DIAGNOSIS — R197 Diarrhea, unspecified: Secondary | ICD-10-CM | POA: Diagnosis not present

## 2020-06-02 DIAGNOSIS — I129 Hypertensive chronic kidney disease with stage 1 through stage 4 chronic kidney disease, or unspecified chronic kidney disease: Secondary | ICD-10-CM | POA: Diagnosis not present

## 2020-06-09 DIAGNOSIS — Z20828 Contact with and (suspected) exposure to other viral communicable diseases: Secondary | ICD-10-CM | POA: Diagnosis not present

## 2020-06-09 DIAGNOSIS — Z1159 Encounter for screening for other viral diseases: Secondary | ICD-10-CM | POA: Diagnosis not present

## 2020-06-16 DIAGNOSIS — Z20828 Contact with and (suspected) exposure to other viral communicable diseases: Secondary | ICD-10-CM | POA: Diagnosis not present

## 2020-06-16 DIAGNOSIS — Z1159 Encounter for screening for other viral diseases: Secondary | ICD-10-CM | POA: Diagnosis not present

## 2020-06-23 DIAGNOSIS — Z1159 Encounter for screening for other viral diseases: Secondary | ICD-10-CM | POA: Diagnosis not present

## 2020-06-23 DIAGNOSIS — Z20828 Contact with and (suspected) exposure to other viral communicable diseases: Secondary | ICD-10-CM | POA: Diagnosis not present

## 2020-06-27 DIAGNOSIS — R69 Illness, unspecified: Secondary | ICD-10-CM | POA: Diagnosis not present

## 2020-06-27 DIAGNOSIS — K591 Functional diarrhea: Secondary | ICD-10-CM | POA: Diagnosis not present

## 2020-06-27 DIAGNOSIS — N1832 Chronic kidney disease, stage 3b: Secondary | ICD-10-CM | POA: Diagnosis not present

## 2020-06-27 DIAGNOSIS — R55 Syncope and collapse: Secondary | ICD-10-CM | POA: Diagnosis not present

## 2020-06-30 DIAGNOSIS — Z20828 Contact with and (suspected) exposure to other viral communicable diseases: Secondary | ICD-10-CM | POA: Diagnosis not present

## 2020-06-30 DIAGNOSIS — Z1159 Encounter for screening for other viral diseases: Secondary | ICD-10-CM | POA: Diagnosis not present

## 2020-07-07 DIAGNOSIS — Z20828 Contact with and (suspected) exposure to other viral communicable diseases: Secondary | ICD-10-CM | POA: Diagnosis not present

## 2020-07-07 DIAGNOSIS — Z1159 Encounter for screening for other viral diseases: Secondary | ICD-10-CM | POA: Diagnosis not present

## 2020-07-14 DIAGNOSIS — Z20828 Contact with and (suspected) exposure to other viral communicable diseases: Secondary | ICD-10-CM | POA: Diagnosis not present

## 2020-07-14 DIAGNOSIS — Z1159 Encounter for screening for other viral diseases: Secondary | ICD-10-CM | POA: Diagnosis not present

## 2020-07-21 DIAGNOSIS — Z1159 Encounter for screening for other viral diseases: Secondary | ICD-10-CM | POA: Diagnosis not present

## 2020-07-21 DIAGNOSIS — Z20828 Contact with and (suspected) exposure to other viral communicable diseases: Secondary | ICD-10-CM | POA: Diagnosis not present

## 2020-07-28 DIAGNOSIS — Z1159 Encounter for screening for other viral diseases: Secondary | ICD-10-CM | POA: Diagnosis not present

## 2020-07-28 DIAGNOSIS — Z20828 Contact with and (suspected) exposure to other viral communicable diseases: Secondary | ICD-10-CM | POA: Diagnosis not present

## 2020-08-09 DIAGNOSIS — R69 Illness, unspecified: Secondary | ICD-10-CM | POA: Diagnosis not present

## 2020-08-09 DIAGNOSIS — I129 Hypertensive chronic kidney disease with stage 1 through stage 4 chronic kidney disease, or unspecified chronic kidney disease: Secondary | ICD-10-CM | POA: Diagnosis not present

## 2020-08-09 DIAGNOSIS — R441 Visual hallucinations: Secondary | ICD-10-CM | POA: Diagnosis not present

## 2020-08-09 DIAGNOSIS — N1831 Chronic kidney disease, stage 3a: Secondary | ICD-10-CM | POA: Diagnosis not present

## 2020-08-11 DIAGNOSIS — Z20828 Contact with and (suspected) exposure to other viral communicable diseases: Secondary | ICD-10-CM | POA: Diagnosis not present

## 2020-08-11 DIAGNOSIS — Z1159 Encounter for screening for other viral diseases: Secondary | ICD-10-CM | POA: Diagnosis not present

## 2020-08-11 NOTE — Telephone Encounter (Signed)
ERROR

## 2020-08-18 DIAGNOSIS — Z1159 Encounter for screening for other viral diseases: Secondary | ICD-10-CM | POA: Diagnosis not present

## 2020-08-18 DIAGNOSIS — Z20828 Contact with and (suspected) exposure to other viral communicable diseases: Secondary | ICD-10-CM | POA: Diagnosis not present

## 2020-08-25 DIAGNOSIS — Z1159 Encounter for screening for other viral diseases: Secondary | ICD-10-CM | POA: Diagnosis not present

## 2020-08-25 DIAGNOSIS — Z20828 Contact with and (suspected) exposure to other viral communicable diseases: Secondary | ICD-10-CM | POA: Diagnosis not present

## 2020-09-01 DIAGNOSIS — Z20828 Contact with and (suspected) exposure to other viral communicable diseases: Secondary | ICD-10-CM | POA: Diagnosis not present

## 2020-09-01 DIAGNOSIS — Z1159 Encounter for screening for other viral diseases: Secondary | ICD-10-CM | POA: Diagnosis not present

## 2020-09-08 DIAGNOSIS — Z20828 Contact with and (suspected) exposure to other viral communicable diseases: Secondary | ICD-10-CM | POA: Diagnosis not present

## 2020-09-08 DIAGNOSIS — Z1159 Encounter for screening for other viral diseases: Secondary | ICD-10-CM | POA: Diagnosis not present

## 2020-09-15 DIAGNOSIS — Z20828 Contact with and (suspected) exposure to other viral communicable diseases: Secondary | ICD-10-CM | POA: Diagnosis not present

## 2020-09-15 DIAGNOSIS — Z1159 Encounter for screening for other viral diseases: Secondary | ICD-10-CM | POA: Diagnosis not present

## 2020-09-22 DIAGNOSIS — Z20828 Contact with and (suspected) exposure to other viral communicable diseases: Secondary | ICD-10-CM | POA: Diagnosis not present

## 2020-09-22 DIAGNOSIS — Z1159 Encounter for screening for other viral diseases: Secondary | ICD-10-CM | POA: Diagnosis not present

## 2020-09-29 DIAGNOSIS — Z1159 Encounter for screening for other viral diseases: Secondary | ICD-10-CM | POA: Diagnosis not present

## 2020-09-29 DIAGNOSIS — Z20828 Contact with and (suspected) exposure to other viral communicable diseases: Secondary | ICD-10-CM | POA: Diagnosis not present

## 2020-10-04 NOTE — Progress Notes (Signed)
Virtual Visit via Video Note The purpose of this virtual visit is to provide medical care while limiting exposure to the novel coronavirus.    Consent was obtained for video visit:  Yes.   Answered questions that patient had about telehealth interaction:  Yes.   I discussed the limitations, risks, security and privacy concerns of performing an evaluation and management service by telemedicine. I also discussed with the patient that there may be a patient responsible charge related to this service. The patient expressed understanding and agreed to proceed.  Pt location: Home Physician Location: office Name of referring provider:  Lajean Manes, MD I connected with Gina Pham at patients initiation/request on 10/05/2020 at  2:50 PM EDT by video enabled telemedicine application and verified that I am speaking with the correct person using two identifiers. 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  Family aware of black box warning.  If we need to start it, would first need EKG to evaluate QT interval 2.  Memantine '10mg'$  BID 3.  Keppra '750mg'$  BID 4.  Paxil '20mg'$  daily 5.  Follow up 6 months.  History of Present Illness:  Gina Pham is an 87 year old right-handed woman who follows up for seizure and cerebral meningioma  She is accompanied by her husband and son who provide collateral history.     UPDATE: Current medication:  Keppra '750mg'$  daily, Paxil '20mg'$  daily, memantine '10mg'$  BID   Diarrhea is controlled.  Takes 1/2 dose of Imodium before bed and Metamucil with lunch.  She sleeps and eats well.  Continues to hallucinate, sees people in house more frequently but manageable.  On 4 to 5 occasions, exhibited  Capgras syndrome - thought husband was an Agricultural consultant.  Lasts several hours.  This upsets her.  Her son spoke with a retired geriatric psychiatrist who recommended 30 to 60 day trial of risperidone.  HISTORY: In February 2015, she was treated for an infection with a sulfa antibiotic and had a severe anaphylactic reaction.  After discharge from the hospital, she had an episode of incoherence.  She followed up with her PCP who noted that she was acting confused.  She was sent to the ED at the [facility], where CT of head showed a meningioma in the right temporal lobe.  She had an EEG which was reportedly abnormal and diagnosed with probable seizure.  She was started on Keppra '750mg'$  daily and has not had a recurrent spell.  Once in a while she may see something that is not there, like an animal in the shadows of the foliage, but it is brief and infrequent.  No episodes of phantosmia, abnormal jerking, loss of awareness or subsequent episode of confusion.  Every couple of years, she would have repeat imaging to monitor the meningioma.  MRI of brain with and without contrast from 01/28/17 showed 3.2 x 1.9 x 2.4 cm meningioma in the right middle cranial fossa (miimally increased in size from prior MRI on 04/13/15 which was 3.2 x 1.7 x 2.4 cm) with no significant change in mild regional mass effect and without adjacent parenchymal signal abnormality.   In early 2020, she and her husband moved from [place] to [place] to live near their  children.  They live at [place]. She has had baseline mild memory deficits and visual hallucinations for some time, but they have both gotten worse over the past year.  She has increased difficulty remembering appointment times, dates, and word-finding difficulty.  She has not had difficulty recognizing close family and friends.  Visual hallucinations are still periodic but definitely more frequent.  She sees animals as well as people in the yard who are not there.  One time,  she saw people across the street with a sign on a door that said "do not enter".  She has not been agitated or confused.  Mood and behavior have been okay.  She overall remains independent in regards to activities of daily living.  Sometimes she has trouble reading clocks or difficulty reaching for objects.  No headaches or clinical seizures.  B12 and thyroid function have been checked by her PCP, and reportedly unremarkable.  She had eye exam back in November 2020 which revealed right inferior quandranopia.  We increased Keppra to '500mg'$  twice daily but reduced dose back to '750mg'$  daily due to increased visual hallucinations.  However, the visual hallucination have not improved and are frequent.  When she looks outside the window, she has seen people and dogs in the back yard not really there.  She becomes agitated at times and wants to talk to management at [facility] about this matter.  She is having difficulty tracking time.  If she has an appointment, she will start getting ready at 3 AM so not to be late.  She continues to have short-term memory problems but also has long-term memory difficulty such as her wedding date.  She has visuospatial deficits, such as difficulty finding her fork on the table in front of her.  She has bilateral cataract.   MRI of brain with and without contrast on 08/25/2019 demonstrated stable 1.9 x 2.5 x 2.7 cm meningioma along the right greater wing and frontotemporal convexity with mild mass effect and no underlying parenchymal edema as well as possible right-sided pituitary microadenoma but no acute infarct or other acute intracranial abnormality.   Mother had meninigoma no depression 100.  Father MI in 17s.   Past Medical History: Past Medical History:  Diagnosis Date   Hyperglycemia    Hypertension    Osteoporosis     Medications: Outpatient Encounter Medications as of 10/05/2020  Medication Sig Note   calcium carbonate (TUMS EX) 750 MG chewable tablet Chew 1 tablet  by mouth daily.    cholecalciferol (VITAMIN D3) 25 MCG (1000 UT) tablet Take 2,000 Units by mouth daily.    co-enzyme Q-10 30 MG capsule Take 30 mg by mouth 3 (three) times daily.    denosumab (PROLIA) 60 MG/ML SOSY injection Inject 60 mg into the skin every 6 (six) months.    levETIRAcetam (KEPPRA) 500 MG tablet Take 1 tablet (500 mg total) by mouth 2 (two) times daily.    magnesium 30 MG tablet Take 250 mg by mouth daily.    Multiple Vitamin (MULTIVITAMIN) tablet Take 1 tablet by mouth daily.    simvastatin (ZOCOR) 40 MG tablet Take 20 mg by mouth daily. 04/07/2020: Taking 20 mg   vitamin C (ASCORBIC ACID) 500 MG tablet Take 500 mg by mouth daily.    No facility-administered encounter medications on file as of 10/05/2020.    Allergies: Allergies  Allergen Reactions   Sertraline     Other reaction(s): diarrhea   Sulfa Antibiotics     Family History: No  family history on file.  Observations/Objective:   There were no vitals taken for this visit. No acute distress.  Alert and oriented.  Speech fluent and not dysarthric.  Language intact.    Follow Up Instructions:    -I discussed the assessment and treatment plan with the patient. The patient was provided an opportunity to ask questions and all were answered. The patient agreed with the plan and demonstrated an understanding of the instructions.   The patient was advised to call back or seek an in-person evaluation if the symptoms worsen or if the condition fails to improve as anticipated.  Dudley Major, DO

## 2020-10-05 ENCOUNTER — Telehealth (INDEPENDENT_AMBULATORY_CARE_PROVIDER_SITE_OTHER): Payer: Medicare HMO | Admitting: Neurology

## 2020-10-05 ENCOUNTER — Encounter: Payer: Self-pay | Admitting: Neurology

## 2020-10-05 ENCOUNTER — Other Ambulatory Visit: Payer: Self-pay

## 2020-10-05 DIAGNOSIS — D320 Benign neoplasm of cerebral meninges: Secondary | ICD-10-CM | POA: Diagnosis not present

## 2020-10-05 DIAGNOSIS — R569 Unspecified convulsions: Secondary | ICD-10-CM | POA: Diagnosis not present

## 2020-10-05 DIAGNOSIS — R69 Illness, unspecified: Secondary | ICD-10-CM | POA: Diagnosis not present

## 2020-10-05 DIAGNOSIS — F0390 Unspecified dementia without behavioral disturbance: Secondary | ICD-10-CM

## 2020-10-06 DIAGNOSIS — Z1159 Encounter for screening for other viral diseases: Secondary | ICD-10-CM | POA: Diagnosis not present

## 2020-10-06 DIAGNOSIS — Z20828 Contact with and (suspected) exposure to other viral communicable diseases: Secondary | ICD-10-CM | POA: Diagnosis not present

## 2020-10-12 ENCOUNTER — Ambulatory Visit: Payer: Medicare HMO | Admitting: Neurology

## 2020-10-12 DIAGNOSIS — Z8616 Personal history of COVID-19: Secondary | ICD-10-CM | POA: Diagnosis not present

## 2020-10-13 DIAGNOSIS — R69 Illness, unspecified: Secondary | ICD-10-CM | POA: Diagnosis not present

## 2020-10-13 DIAGNOSIS — R441 Visual hallucinations: Secondary | ICD-10-CM | POA: Diagnosis not present

## 2020-10-13 DIAGNOSIS — I129 Hypertensive chronic kidney disease with stage 1 through stage 4 chronic kidney disease, or unspecified chronic kidney disease: Secondary | ICD-10-CM | POA: Diagnosis not present

## 2020-10-13 DIAGNOSIS — M810 Age-related osteoporosis without current pathological fracture: Secondary | ICD-10-CM | POA: Diagnosis not present

## 2020-10-13 DIAGNOSIS — N1832 Chronic kidney disease, stage 3b: Secondary | ICD-10-CM | POA: Diagnosis not present

## 2020-10-13 DIAGNOSIS — G301 Alzheimer's disease with late onset: Secondary | ICD-10-CM | POA: Diagnosis not present

## 2020-10-19 DIAGNOSIS — N1832 Chronic kidney disease, stage 3b: Secondary | ICD-10-CM | POA: Diagnosis not present

## 2020-10-19 DIAGNOSIS — Z20828 Contact with and (suspected) exposure to other viral communicable diseases: Secondary | ICD-10-CM | POA: Diagnosis not present

## 2020-10-19 DIAGNOSIS — M810 Age-related osteoporosis without current pathological fracture: Secondary | ICD-10-CM | POA: Diagnosis not present

## 2020-10-26 DIAGNOSIS — Z20828 Contact with and (suspected) exposure to other viral communicable diseases: Secondary | ICD-10-CM | POA: Diagnosis not present

## 2020-10-27 DIAGNOSIS — D1801 Hemangioma of skin and subcutaneous tissue: Secondary | ICD-10-CM | POA: Diagnosis not present

## 2020-10-27 DIAGNOSIS — L821 Other seborrheic keratosis: Secondary | ICD-10-CM | POA: Diagnosis not present

## 2020-10-27 DIAGNOSIS — L570 Actinic keratosis: Secondary | ICD-10-CM | POA: Diagnosis not present

## 2020-10-27 DIAGNOSIS — L814 Other melanin hyperpigmentation: Secondary | ICD-10-CM | POA: Diagnosis not present

## 2020-10-27 DIAGNOSIS — D225 Melanocytic nevi of trunk: Secondary | ICD-10-CM | POA: Diagnosis not present

## 2020-10-28 DIAGNOSIS — N814 Uterovaginal prolapse, unspecified: Secondary | ICD-10-CM | POA: Diagnosis not present

## 2020-11-02 DIAGNOSIS — N814 Uterovaginal prolapse, unspecified: Secondary | ICD-10-CM | POA: Diagnosis not present

## 2020-11-02 DIAGNOSIS — N950 Postmenopausal bleeding: Secondary | ICD-10-CM | POA: Diagnosis not present

## 2020-11-02 DIAGNOSIS — N8110 Cystocele, unspecified: Secondary | ICD-10-CM | POA: Diagnosis not present

## 2020-11-02 DIAGNOSIS — Z8554 Personal history of malignant neoplasm of ureter: Secondary | ICD-10-CM | POA: Diagnosis not present

## 2020-11-02 DIAGNOSIS — Z20828 Contact with and (suspected) exposure to other viral communicable diseases: Secondary | ICD-10-CM | POA: Diagnosis not present

## 2020-11-09 DIAGNOSIS — H5340 Unspecified visual field defects: Secondary | ICD-10-CM | POA: Diagnosis not present

## 2020-11-09 DIAGNOSIS — Z20828 Contact with and (suspected) exposure to other viral communicable diseases: Secondary | ICD-10-CM | POA: Diagnosis not present

## 2020-11-09 DIAGNOSIS — H53453 Other localized visual field defect, bilateral: Secondary | ICD-10-CM | POA: Diagnosis not present

## 2020-11-09 DIAGNOSIS — H2513 Age-related nuclear cataract, bilateral: Secondary | ICD-10-CM | POA: Diagnosis not present

## 2020-11-15 DIAGNOSIS — N8110 Cystocele, unspecified: Secondary | ICD-10-CM | POA: Diagnosis not present

## 2020-11-15 DIAGNOSIS — N814 Uterovaginal prolapse, unspecified: Secondary | ICD-10-CM | POA: Diagnosis not present

## 2020-11-15 DIAGNOSIS — N950 Postmenopausal bleeding: Secondary | ICD-10-CM | POA: Diagnosis not present

## 2020-11-16 DIAGNOSIS — Z20828 Contact with and (suspected) exposure to other viral communicable diseases: Secondary | ICD-10-CM | POA: Diagnosis not present

## 2020-11-23 DIAGNOSIS — Z20828 Contact with and (suspected) exposure to other viral communicable diseases: Secondary | ICD-10-CM | POA: Diagnosis not present

## 2020-11-30 DIAGNOSIS — Z20828 Contact with and (suspected) exposure to other viral communicable diseases: Secondary | ICD-10-CM | POA: Diagnosis not present

## 2020-12-07 DIAGNOSIS — Z8616 Personal history of COVID-19: Secondary | ICD-10-CM | POA: Diagnosis not present

## 2020-12-14 DIAGNOSIS — Z20828 Contact with and (suspected) exposure to other viral communicable diseases: Secondary | ICD-10-CM | POA: Diagnosis not present

## 2020-12-19 ENCOUNTER — Encounter: Payer: Self-pay | Admitting: Neurology

## 2020-12-19 NOTE — Progress Notes (Signed)
Letter mailed to pt address per pt husband request.

## 2020-12-21 DIAGNOSIS — Z20828 Contact with and (suspected) exposure to other viral communicable diseases: Secondary | ICD-10-CM | POA: Diagnosis not present

## 2020-12-28 DIAGNOSIS — Z20828 Contact with and (suspected) exposure to other viral communicable diseases: Secondary | ICD-10-CM | POA: Diagnosis not present

## 2021-01-04 DIAGNOSIS — Z20828 Contact with and (suspected) exposure to other viral communicable diseases: Secondary | ICD-10-CM | POA: Diagnosis not present

## 2021-01-11 DIAGNOSIS — Z20828 Contact with and (suspected) exposure to other viral communicable diseases: Secondary | ICD-10-CM | POA: Diagnosis not present

## 2021-01-18 DIAGNOSIS — Z8616 Personal history of COVID-19: Secondary | ICD-10-CM | POA: Diagnosis not present

## 2021-01-25 DIAGNOSIS — Z20828 Contact with and (suspected) exposure to other viral communicable diseases: Secondary | ICD-10-CM | POA: Diagnosis not present

## 2021-01-30 DIAGNOSIS — Z1159 Encounter for screening for other viral diseases: Secondary | ICD-10-CM | POA: Diagnosis not present

## 2021-01-30 DIAGNOSIS — Z20828 Contact with and (suspected) exposure to other viral communicable diseases: Secondary | ICD-10-CM | POA: Diagnosis not present

## 2021-02-13 DIAGNOSIS — R69 Illness, unspecified: Secondary | ICD-10-CM | POA: Diagnosis not present

## 2021-02-13 DIAGNOSIS — Z1159 Encounter for screening for other viral diseases: Secondary | ICD-10-CM | POA: Diagnosis not present

## 2021-02-13 DIAGNOSIS — F03911 Unspecified dementia, unspecified severity, with agitation: Secondary | ICD-10-CM | POA: Diagnosis not present

## 2021-02-13 DIAGNOSIS — Z23 Encounter for immunization: Secondary | ICD-10-CM | POA: Diagnosis not present

## 2021-02-13 DIAGNOSIS — Z1389 Encounter for screening for other disorder: Secondary | ICD-10-CM | POA: Diagnosis not present

## 2021-02-13 DIAGNOSIS — G40909 Epilepsy, unspecified, not intractable, without status epilepticus: Secondary | ICD-10-CM | POA: Diagnosis not present

## 2021-02-13 DIAGNOSIS — R7303 Prediabetes: Secondary | ICD-10-CM | POA: Diagnosis not present

## 2021-02-13 DIAGNOSIS — Z1331 Encounter for screening for depression: Secondary | ICD-10-CM | POA: Diagnosis not present

## 2021-02-13 DIAGNOSIS — Z20828 Contact with and (suspected) exposure to other viral communicable diseases: Secondary | ICD-10-CM | POA: Diagnosis not present

## 2021-02-13 DIAGNOSIS — G301 Alzheimer's disease with late onset: Secondary | ICD-10-CM | POA: Diagnosis not present

## 2021-02-13 DIAGNOSIS — E7800 Pure hypercholesterolemia, unspecified: Secondary | ICD-10-CM | POA: Diagnosis not present

## 2021-02-13 DIAGNOSIS — I129 Hypertensive chronic kidney disease with stage 1 through stage 4 chronic kidney disease, or unspecified chronic kidney disease: Secondary | ICD-10-CM | POA: Diagnosis not present

## 2021-02-13 DIAGNOSIS — D329 Benign neoplasm of meninges, unspecified: Secondary | ICD-10-CM | POA: Diagnosis not present

## 2021-02-13 DIAGNOSIS — Z0000 Encounter for general adult medical examination without abnormal findings: Secondary | ICD-10-CM | POA: Diagnosis not present

## 2021-02-13 DIAGNOSIS — Z79899 Other long term (current) drug therapy: Secondary | ICD-10-CM | POA: Diagnosis not present

## 2021-02-15 DIAGNOSIS — Z1159 Encounter for screening for other viral diseases: Secondary | ICD-10-CM | POA: Diagnosis not present

## 2021-02-15 DIAGNOSIS — Z20828 Contact with and (suspected) exposure to other viral communicable diseases: Secondary | ICD-10-CM | POA: Diagnosis not present

## 2021-02-17 DIAGNOSIS — Z20828 Contact with and (suspected) exposure to other viral communicable diseases: Secondary | ICD-10-CM | POA: Diagnosis not present

## 2021-02-17 DIAGNOSIS — Z1159 Encounter for screening for other viral diseases: Secondary | ICD-10-CM | POA: Diagnosis not present

## 2021-02-20 DIAGNOSIS — Z1159 Encounter for screening for other viral diseases: Secondary | ICD-10-CM | POA: Diagnosis not present

## 2021-02-20 DIAGNOSIS — Z20828 Contact with and (suspected) exposure to other viral communicable diseases: Secondary | ICD-10-CM | POA: Diagnosis not present

## 2021-02-27 DIAGNOSIS — Z20828 Contact with and (suspected) exposure to other viral communicable diseases: Secondary | ICD-10-CM | POA: Diagnosis not present

## 2021-02-27 DIAGNOSIS — Z1159 Encounter for screening for other viral diseases: Secondary | ICD-10-CM | POA: Diagnosis not present

## 2021-03-01 DIAGNOSIS — Z20828 Contact with and (suspected) exposure to other viral communicable diseases: Secondary | ICD-10-CM | POA: Diagnosis not present

## 2021-03-01 DIAGNOSIS — Z1159 Encounter for screening for other viral diseases: Secondary | ICD-10-CM | POA: Diagnosis not present

## 2021-03-08 DIAGNOSIS — Z1159 Encounter for screening for other viral diseases: Secondary | ICD-10-CM | POA: Diagnosis not present

## 2021-03-08 DIAGNOSIS — Z20828 Contact with and (suspected) exposure to other viral communicable diseases: Secondary | ICD-10-CM | POA: Diagnosis not present

## 2021-03-13 DIAGNOSIS — Z20822 Contact with and (suspected) exposure to covid-19: Secondary | ICD-10-CM | POA: Diagnosis not present

## 2021-03-20 DIAGNOSIS — Z20822 Contact with and (suspected) exposure to covid-19: Secondary | ICD-10-CM | POA: Diagnosis not present

## 2021-04-07 ENCOUNTER — Ambulatory Visit: Payer: Medicare HMO | Admitting: Neurology

## 2021-04-25 ENCOUNTER — Ambulatory Visit: Payer: Medicare HMO | Admitting: Neurology

## 2021-04-26 IMAGING — MR MR HEAD WO/W CM
15 of 16 series · 47 of 48 positions shown · IV contrast (multihance)
Comparison: None available

CLINICAL DATA: Meningioma

EXAM:
MRI HEAD WITHOUT AND WITH CONTRAST
TECHNIQUE: Multiplanar, multiecho pulse sequences of the brain and surrounding
structures were obtained without and with intravenous contrast.
CONTRAST:  13mL MULTIHANCE GADOBENATE DIMEGLUMINE 529 MG/ML IV SOLN

[Series 2: T1 · sagittal · 5.0mm · 0.45mm/px · 1 of 22 slices shown]
[im 1/22]
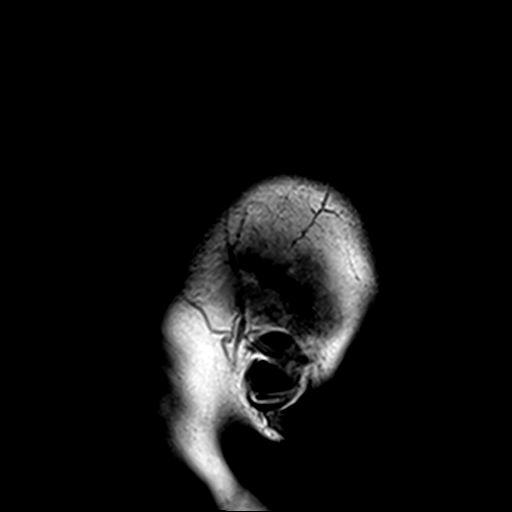

[Series 3: DWI · axial · 3.0mm · 1.80mm/px · z∈[-48,+98]mm · 5 of 95 slices shown (1 of 4)]
[im 1/95]
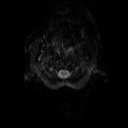
[im 24/95]
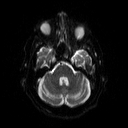
[im 48/95]
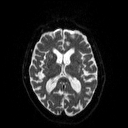
[im 71/95]
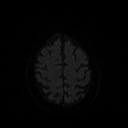
[im 95/95]
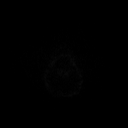

[Series 4: DWI · axial · 3.0mm · 1.80mm/px · z∈[-48,+98]mm · 3 of 50 slices shown (2 of 4)]
[im 1/50]
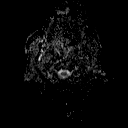
[im 25/50]
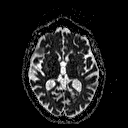
[im 50/50]
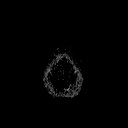

[Series 5: DWI · coronal · 5.0mm · 1.80mm/px · 3 of 66 slices shown (3 of 4)]
[im 1/66]
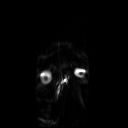
[im 33/66]
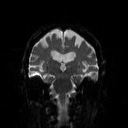
[im 66/66]
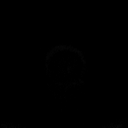

[Series 6: DWI · coronal · 5.0mm · 1.80mm/px · 2 of 34 slices shown (4 of 4)]
[im 1/34]
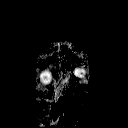
[im 34/34]
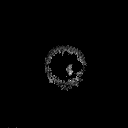

[Series 7: T2 · axial · 5.0mm · 0.51mm/px · 1 of 22 slices shown (1 of 3)]
[im 1/22]
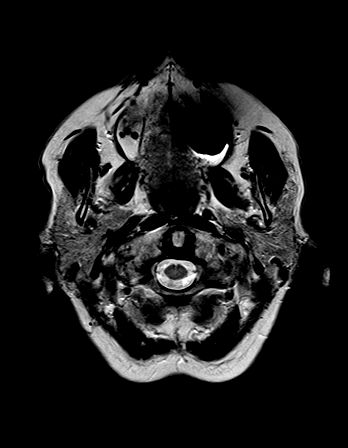

[Series 8: FLAIR · axial · 3.0mm · 0.45mm/px · z∈[-42,+92]mm · 2 of 30 slices shown (1 of 2)]
[im 1/30]
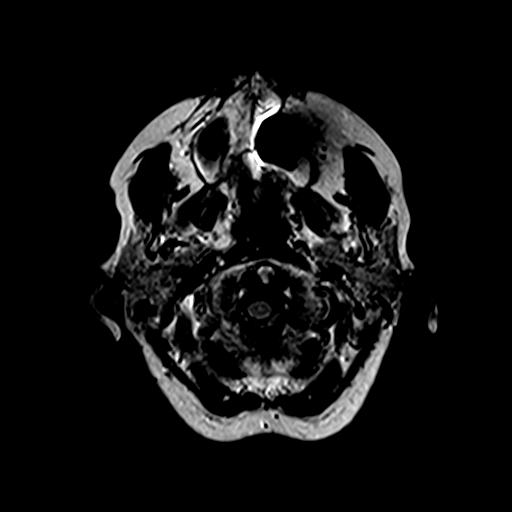
[im 30/30]
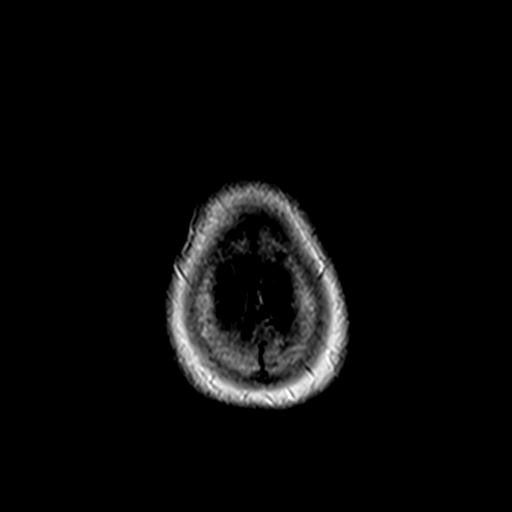

[Series 10: swi_images · axial · 4.0mm · 0.90mm/px · z∈[-45,+94]mm · 2 of 36 slices shown]
[im 1/36]
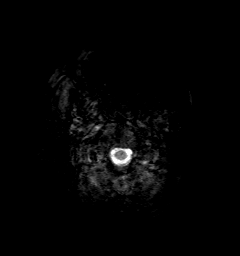
[im 36/36]
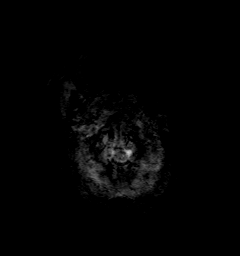

[Series 11: t1_mpr_tra · axial · 1.0mm · 0.75mm/px · z∈[-47,+95]mm · 7 of 144 slices shown (1 of 2)]
[im 1/144]
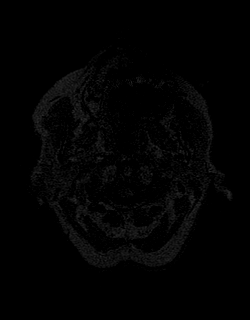
[im 24/144]
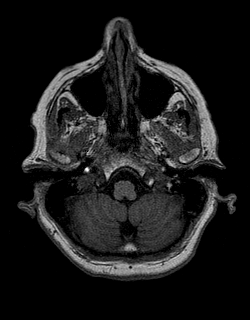
[im 48/144]
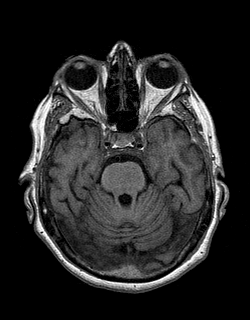
[im 72/144]
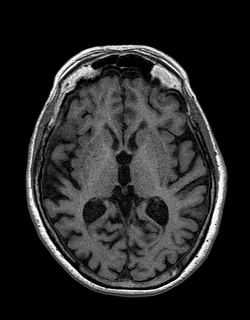
[im 96/144]
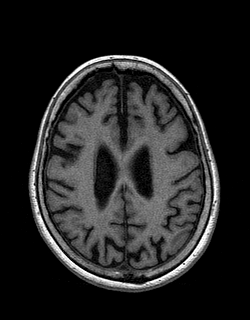
[im 120/144]
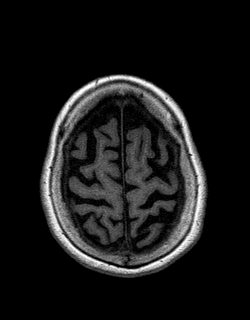
[im 144/144]
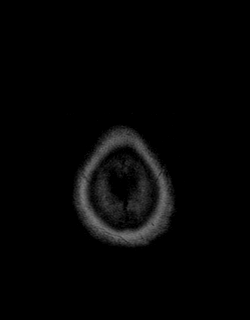

[Series 12: T2 · coronal · 3.0mm · 0.23mm/px · 2 of 31 slices shown (2 of 3)]
[im 1/31]
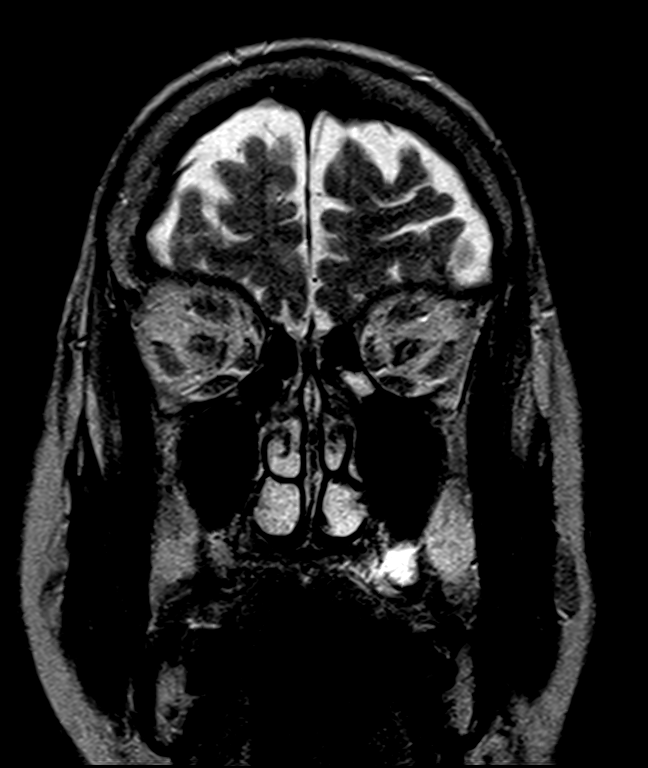
[im 31/31]
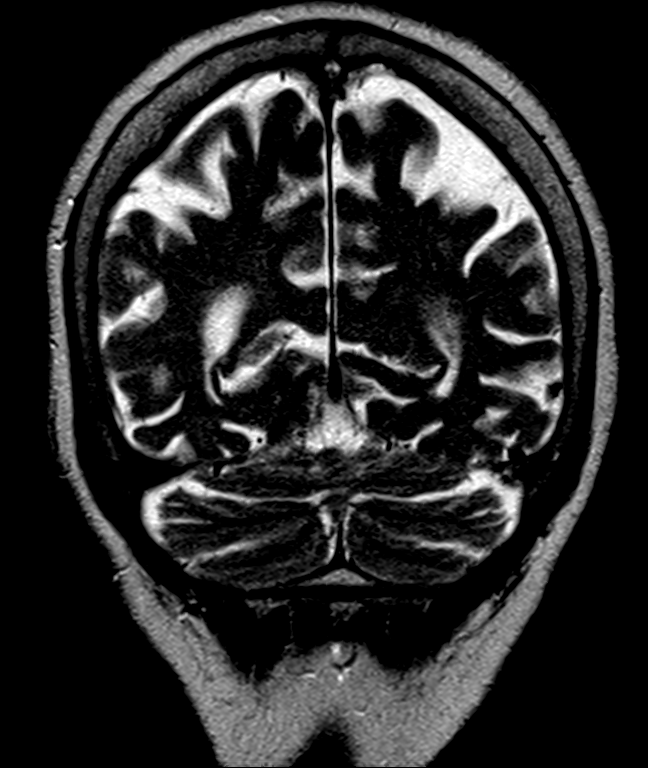

[Series 13: FLAIR · coronal · 3.0mm · 0.70mm/px · 2 of 31 slices shown (2 of 2)]
[im 1/31]
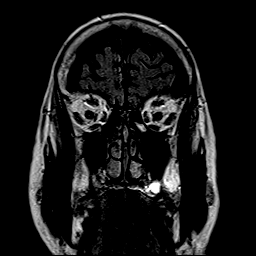
[im 31/31]
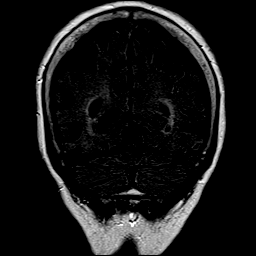

[Series 14: <mpr range> · coronal · 1.0mm · 0.75mm/px · 8 of 166 slices shown]
[im 1/166]
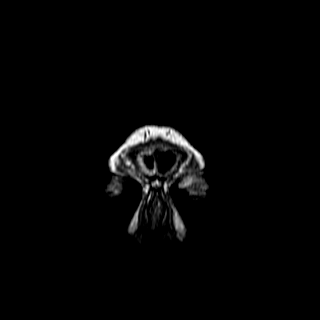
[im 24/166]
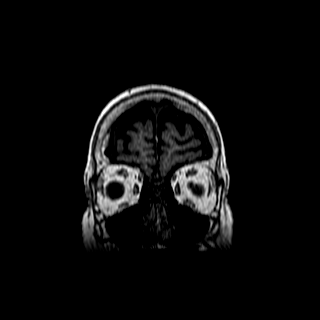
[im 48/166]
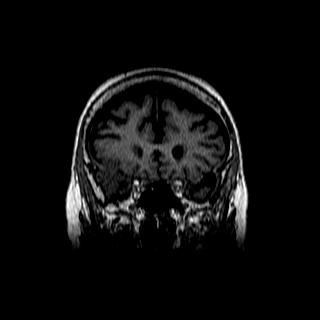
[im 71/166]
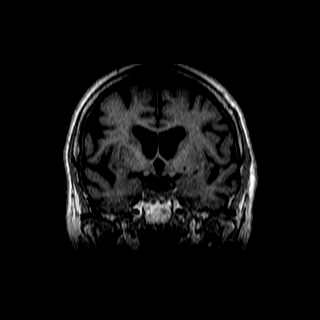
[im 95/166]
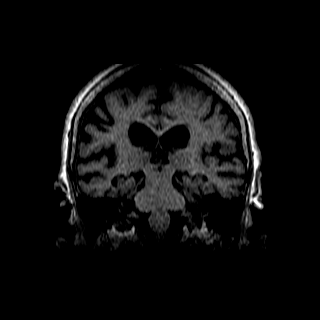
[im 118/166]
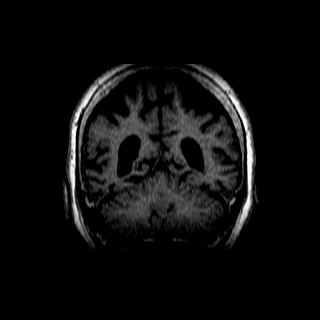
[im 142/166]
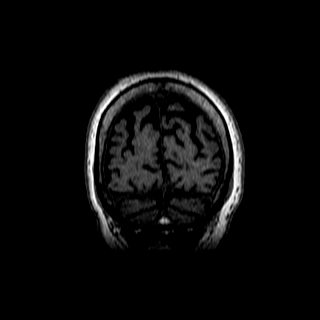
[im 166/166]
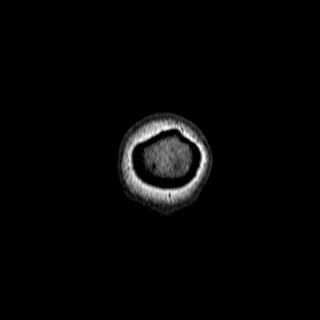

[Series 15: T2 · coronal · 5.0mm · 0.45mm/px · 1 of 27 slices shown (3 of 3)]
[im 1/27]
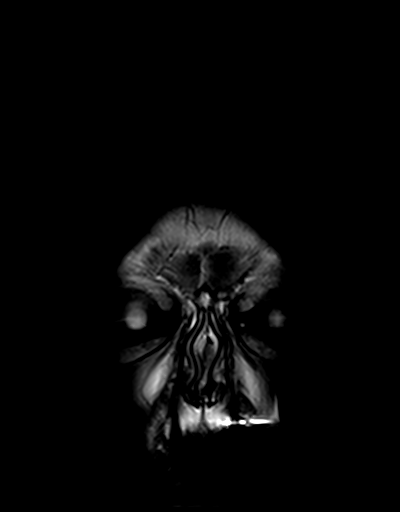

[Series 16: t1_mpr_tra · axial · 1.0mm · 0.75mm/px · z∈[-47,+95]mm · 7 of 144 slices shown (2 of 2)]
[im 1/144]
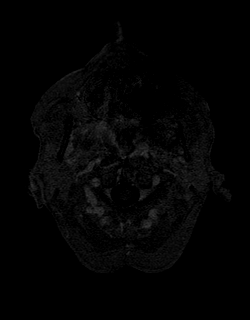
[im 24/144]
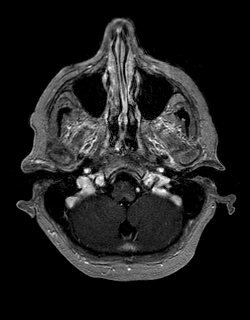
[im 48/144]
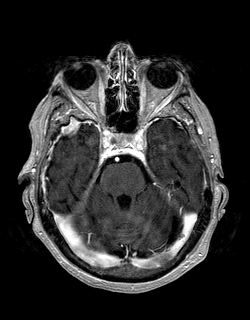
[im 72/144]
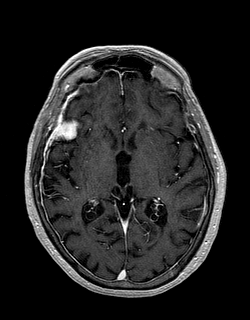
[im 96/144]
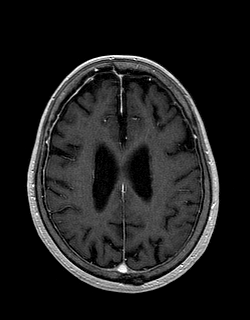
[im 120/144]
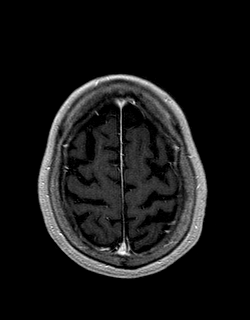
[im 144/144]
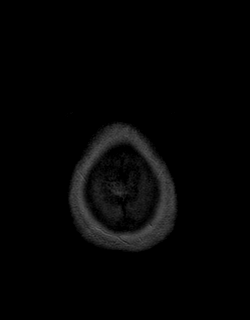

[Series 17: post cor · coronal · 5.0mm · 0.45mm/px · 1 of 27 slices shown]
[im 1/27]
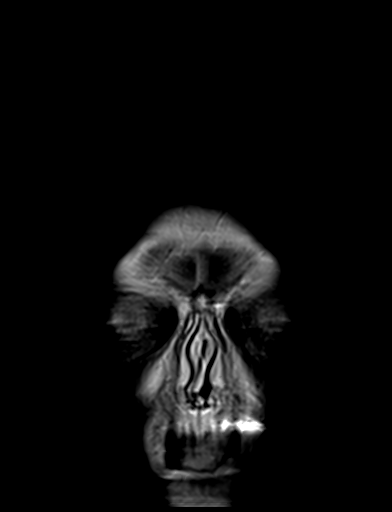

[47 of 48 positions shown; findings below may reference images not displayed]

FINDINGS: Brain: Extra-axial dural-based enhancing lesion along the right
greater sphenoid wing and frontotemporal convexity measures
approximately 1.9 x 2.5 x 2.7 cm and is most consistent with a
meningioma. There is no underlying parenchymal edema. Mass effect is
mild.

There is no acute infarction or intracranial hemorrhage. There is no
hydrocephalus or extra-axial fluid collection.

Vascular: Major vessel flow voids at the skull base are preserved.

Skull and upper cervical spine: Normal marrow signal is preserved.

Sinuses/Orbits: Mild mucosal thickening.  Orbits are unremarkable.

Other: Incompletely evaluated asymmetric fullness and differential
signal at the right aspect of the pituitary. Mastoid air cells are
clear.
IMPRESSION: Right greater sphenoid wing/temporal convexity meningioma as
described. No underlying parenchymal edema.

Asymmetric fullness and differential signal at the right aspect of
the pituitary. This may reflect a microadenoma and would be better
evaluated with dedicated imaging of the sella as clinically
indicated in this patient.

## 2021-10-03 DEATH — deceased
